# Patient Record
Sex: Female | Born: 1945 | Race: White | Hispanic: No | Marital: Married | Smoking: Never smoker
Health system: Southern US, Community
[De-identification: ages and names within clinical notes are randomized; demographics above are authoritative.]

## PROBLEM LIST (undated history)

## (undated) DIAGNOSIS — I10 Essential (primary) hypertension: Secondary | ICD-10-CM

---

## 2015-11-14 ENCOUNTER — Emergency Department (HOSPITAL_COMMUNITY)
Admission: EM | Admit: 2015-11-14 | Discharge: 2015-11-14 | Disposition: A | Discharge status: Home / Self Care | Payer: Medicare Other | Attending: Emergency Medicine | Admitting: Emergency Medicine

## 2015-11-14 ENCOUNTER — Encounter (HOSPITAL_COMMUNITY): Payer: Self-pay

## 2015-11-14 ENCOUNTER — Emergency Department (HOSPITAL_COMMUNITY): Payer: Medicare Other

## 2015-11-14 DIAGNOSIS — I10 Essential (primary) hypertension: Secondary | ICD-10-CM | POA: Insufficient documentation

## 2015-11-14 DIAGNOSIS — M545 Low back pain, unspecified: Secondary | ICD-10-CM

## 2015-11-14 DIAGNOSIS — M79605 Pain in left leg: Secondary | ICD-10-CM | POA: Diagnosis not present

## 2015-11-14 DIAGNOSIS — Z7982 Long term (current) use of aspirin: Secondary | ICD-10-CM | POA: Insufficient documentation

## 2015-11-14 HISTORY — DX: Essential (primary) hypertension: I10

## 2015-11-14 LAB — BASIC METABOLIC PANEL
ANION GAP: 9 (ref 5–15)
BUN: 21 mg/dL — ABNORMAL HIGH (ref 6–20)
CHLORIDE: 107 mmol/L (ref 101–111)
CO2: 24 mmol/L (ref 22–32)
CREATININE: 1.15 mg/dL — AB (ref 0.44–1.00)
Calcium: 9.2 mg/dL (ref 8.9–10.3)
GFR calc non Af Amer: 47 mL/min — ABNORMAL LOW (ref >60)
GFR, EST AFRICAN AMERICAN: 54 mL/min — AB (ref >60)
Glucose, Bld: 100 mg/dL — ABNORMAL HIGH (ref 65–99)
POTASSIUM: 3.8 mmol/L (ref 3.5–5.1)
SODIUM: 140 mmol/L (ref 135–145)

## 2015-11-14 LAB — D-DIMER, QUANTITATIVE: D-Dimer, Quant: <0.27 ug/mL-FEU (ref 0.00–0.50)

## 2015-11-14 MED ORDER — PREDNISONE 20 MG PO TABS
60.0000 mg | ORAL_TABLET | ORAL | Status: AC
  Administered 2015-11-14: 60 mg via ORAL
  Filled 2015-11-14: qty 3

## 2015-11-14 MED ORDER — KETOROLAC TROMETHAMINE 30 MG/ML IJ SOLN
15.0000 mg | Freq: Once | INTRAMUSCULAR | Status: AC
  Administered 2015-11-14: 15 mg via INTRAMUSCULAR
  Filled 2015-11-14: qty 1

## 2015-11-14 MED ORDER — PREDNISONE 20 MG PO TABS: 40.0000 mg | ORAL_TABLET | Freq: Every day | ORAL | Status: AC

## 2015-11-14 MED ORDER — NAPROXEN 500 MG PO TABS: 500.0000 mg | ORAL_TABLET | Freq: Two times a day (BID) | ORAL | Status: AC

## 2015-11-14 NOTE — ED Notes (Signed)
Patient transported to X-ray 

## 2015-11-14 NOTE — ED Notes (Addendum)
Patient complains of left leg pain x 2 days. Intermittent described as sharp and soreness when the pain resides. Radiates from hip to knee, denies trauma. [place] but pain started Thursday evening

## 2015-11-14 NOTE — ED Provider Notes (Signed)
CSN: [id number]     Arrival date & time 11/14/15  0809 History   First MD Initiated Contact with Patient 11/14/15 [phone]     Chief Complaint  Patient presents with  . leg pain      (Consider location/radiation/quality/duration/timing/severity/associated sxs/prior Treatment) HPI Patient presents with concern of pain in the left lower back, left leg. Pain began about 2 days ago, without clear precipitant. Since onset symptoms of been persistent, though with waxing/waning severity. Pain is sharp, burning, with intermittent bursts in the left posterior gluteal area radiating down the left leg inferiorly on the lateral and posterior aspect. Patient did recently travel, though this occurred after initiation of the pain. She denies medical problems, beyond hypertension. She does acknowledge swelling in the left leg greater than right leg, though this may be chronic. Patient also notices new discoloration in the left lateral ankle, no clear precipitant. Currently no other complaints, including dyspnea, chest pain, lightheadedness.  Past Medical History  Diagnosis Date  . Hypertension    History reviewed. No pertinent past surgical history. No family history on file. Social History  Substance Use Topics  . Smoking status: Never Smoker   . Smokeless tobacco: None  . Alcohol Use: None   OB History    No data available     Review of Systems  Constitutional:       Per HPI, otherwise negative  HENT:       Per HPI, otherwise negative  Respiratory:       Per HPI, otherwise negative  Cardiovascular:       Per HPI, otherwise negative  Gastrointestinal: Negative for vomiting.  Endocrine:       Negative aside from HPI  Genitourinary:       Neg aside from HPI   Musculoskeletal:       Per HPI, otherwise negative  Skin: Positive for color change.  Neurological: Negative for syncope.      Allergies  Review of patient's allergies indicates no known allergies.  Home Medications    Prior to Admission medications   Medication Sig Start Date End Date Taking? Authorizing Provider  aspirin 81 MG tablet Take 81 mg by mouth daily.   Yes Historical Provider, MD  losartan (COZAAR) 50 MG tablet Take 50 mg by mouth daily.   Yes Historical Provider, MD  Multiple Vitamins-Minerals (MULTIVITAMIN WITH MINERALS) tablet Take 1 tablet by mouth daily.   Yes Historical Provider, MD   BP 123/82 mmHg  Pulse 83  Temp(Src) 98.5 F (36.9 C) (Oral)  Resp 20  Ht 5\' 2"  (1.575 m)  Wt 155 lb (70.308 kg)  BMI 28.34 kg/m2  SpO2 98% Physical Exam  Constitutional: She is oriented to person, place, and time. She appears well-developed and well-nourished. No distress.  HENT:  Head: Normocephalic and atraumatic.  Eyes: Conjunctivae and EOM are normal.  Cardiovascular: Normal rate and regular rhythm.   Pulmonary/Chest: Effort normal and breath sounds normal. No stridor. No respiratory distress.  Abdominal: She exhibits no distension.  Musculoskeletal: She exhibits no edema.  Left leg is larger than the right diffusely, no gross deformities  Neurological: She is alert and oriented to person, place, and time. No cranial nerve deficit.  Diminished strength in the left lower extremity hip, knee 4/5 compared to normal strength on the right, otherwise unremarkable neurologic exam  Skin: Skin is warm and dry.     Psychiatric: She has a normal mood and affect.  Nursing note and vitals reviewed.   ED Course  Procedures (including critical care time) Labs Review Labs Reviewed  BASIC METABOLIC PANEL - Abnormal; Notable for the following:    Glucose, Bld 100 (*)    BUN 21 (*)    Creatinine, Ser 1.15 (*)    GFR calc non Af Amer 47 (*)    GFR calc Af Amer 54 (*)    All other components within normal limits  D-DIMER, QUANTITATIVE (NOT AT [facility])    Imaging Review Dg Lumbar Spine 2-3 Views  11/14/2015  CLINICAL DATA:  Acute left lower extremity pain without known injury. EXAM: LUMBAR SPINE -  2-3 VIEW COMPARISON:  None. FINDINGS: There is no evidence of lumbar spine fracture. Alignment is normal. Intervertebral disc spaces are maintained. IMPRESSION: Normal lumbar spine. Electronically Signed   By: Lupita Raider, M.D.   On: 11/14/2015 10:00   I have personally reviewed and evaluated these images and lab results as part of my medical decision-making.  11:03 AM On repeat exam the patient appears well. We discussed all findings, including negative d-dimer, x-ray at length. With concern for new sciatica, patient will start a short course of medication, follow-up with her primary care physician upon returning home.   MDM  Patient presents with new left lower back, leg pain. Patient is elderly, has no prior similar events. Here patient's valuation is reassuring, no evidence for fracture, lytic lesions, nor evidence for DVT. Patient does have some ecchymosis in her distal ankle, though this is likely secondary to her recent car trip. Patient's otherwise reassuring findings, stable vitals, minimal pain without motion all reassuring. 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   Gerhard Munch, MD 11/14/15 1104

## 2015-11-14 NOTE — ED Notes (Signed)
Placed patient on the monitor.

## 2015-11-14 NOTE — Discharge Instructions (Signed)
As discussed, it is important that you follow-up with your physician upon returning to [place] .  In the interim, please take medication as directed, and do not hesitate to return here if you develop new, or concerning changes in your condition.

## 2017-06-19 IMAGING — DX DG LUMBAR SPINE 2-3V
3 series · 3 of 3 positions shown · non-contrast
Comparison: None.

CLINICAL DATA: Acute left lower extremity pain without known
injury.

EXAM:
LUMBAR SPINE - 2-3 VIEW

[l-spine ap]
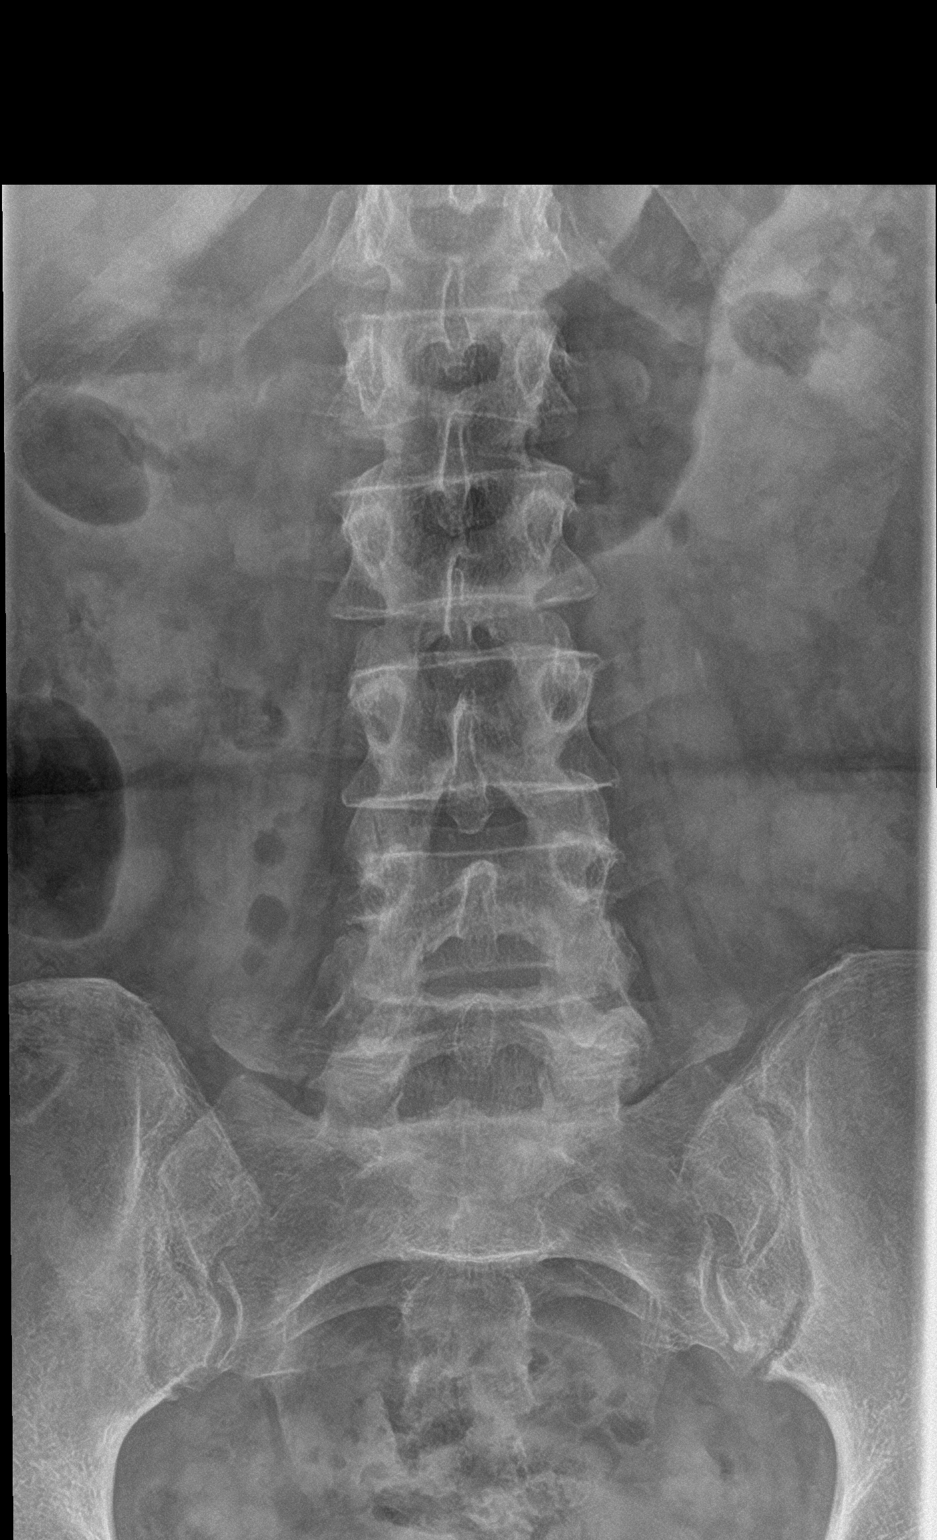

[l-spine lat]
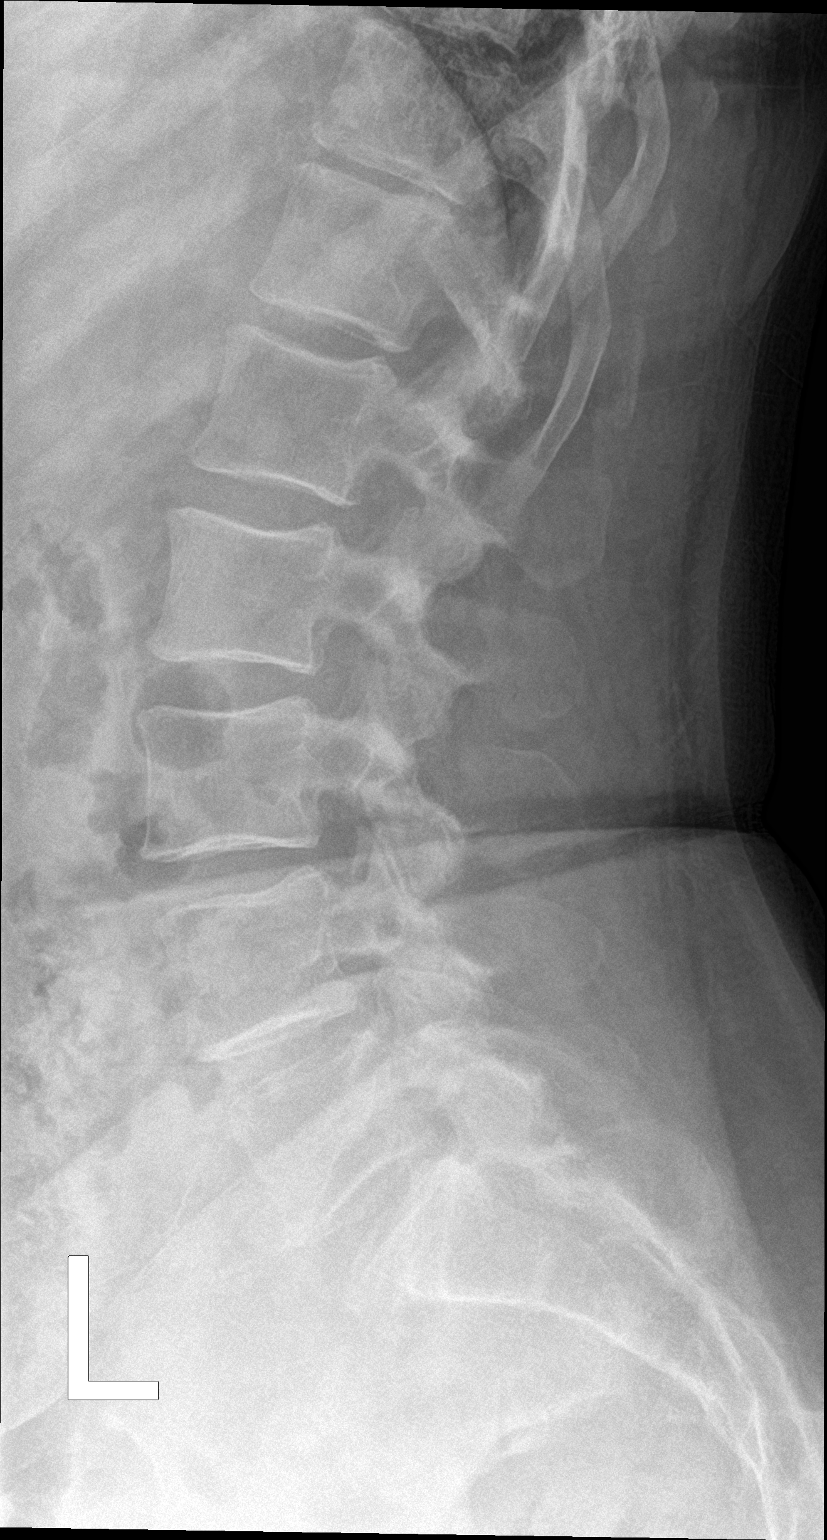

[l-spine spot]
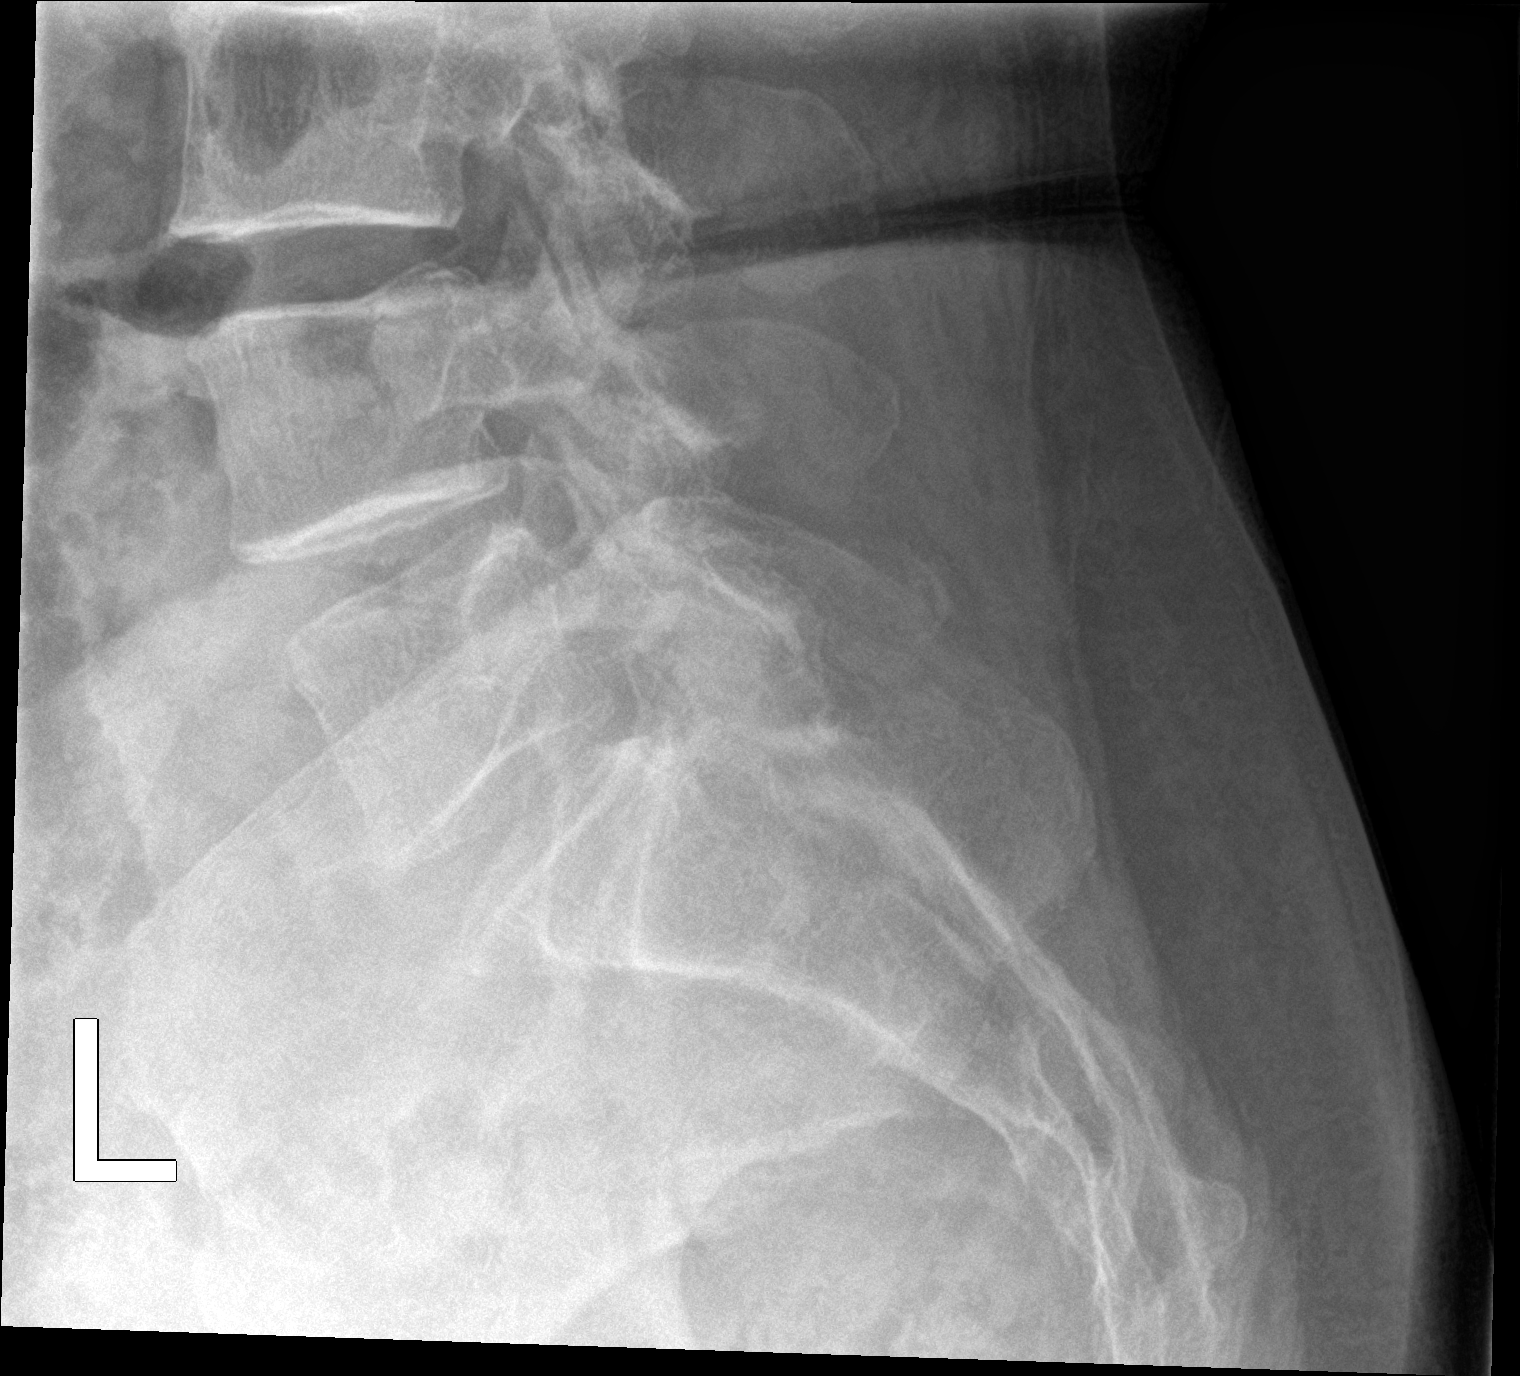

[3 of 3 positions shown; findings below may reference images not displayed]

FINDINGS: There is no evidence of lumbar spine fracture. Alignment is normal.
Intervertebral disc spaces are maintained.
IMPRESSION: Normal lumbar spine.

## 2018-04-16 ENCOUNTER — Encounter: Payer: Self-pay | Admitting: Emergency Medicine

## 2018-04-16 ENCOUNTER — Emergency Department (HOSPITAL_COMMUNITY)
Admission: EM | Admit: 2018-04-16 | Discharge: 2018-04-16 | Disposition: A | Discharge status: Home / Self Care | Payer: Medicare Other | Attending: Emergency Medicine | Admitting: Emergency Medicine

## 2018-04-16 DIAGNOSIS — I10 Essential (primary) hypertension: Secondary | ICD-10-CM | POA: Diagnosis not present

## 2018-04-16 DIAGNOSIS — Z79899 Other long term (current) drug therapy: Secondary | ICD-10-CM | POA: Insufficient documentation

## 2018-04-16 DIAGNOSIS — N938 Other specified abnormal uterine and vaginal bleeding: Secondary | ICD-10-CM | POA: Insufficient documentation

## 2018-04-16 DIAGNOSIS — N939 Abnormal uterine and vaginal bleeding, unspecified: Secondary | ICD-10-CM

## 2018-04-16 DIAGNOSIS — Z7982 Long term (current) use of aspirin: Secondary | ICD-10-CM | POA: Diagnosis not present

## 2018-04-16 LAB — CBC WITH DIFFERENTIAL/PLATELET
ABS IMMATURE GRANULOCYTES: 0.02 10*3/uL (ref 0.00–0.07)
BASOS PCT: 1 %
Basophils Absolute: 0 10*3/uL (ref 0.0–0.1)
Eosinophils Absolute: 0.2 10*3/uL (ref 0.0–0.5)
Eosinophils Relative: 4 %
HCT: 40.4 % (ref 36.0–46.0)
Hemoglobin: 12.5 g/dL (ref 12.0–15.0)
IMMATURE GRANULOCYTES: 0 %
Lymphocytes Relative: 27 %
Lymphs Abs: 1.6 10*3/uL (ref 0.7–4.0)
MCH: 26.5 pg (ref 26.0–34.0)
MCHC: 30.9 g/dL (ref 30.0–36.0)
MCV: 85.8 fL (ref 80.0–100.0)
Monocytes Absolute: 0.5 10*3/uL (ref 0.1–1.0)
Monocytes Relative: 9 %
NEUTROS ABS: 3.4 10*3/uL (ref 1.7–7.7)
Neutrophils Relative %: 59 %
PLATELETS: 283 10*3/uL (ref 150–400)
RBC: 4.71 MIL/uL (ref 3.87–5.11)
RDW: 13 % (ref 11.5–15.5)
WBC: 5.8 10*3/uL (ref 4.0–10.5)
nRBC: 0 % (ref 0.0–0.2)

## 2018-04-16 LAB — BASIC METABOLIC PANEL
Anion gap: 6 (ref 5–15)
BUN: 14 mg/dL (ref 8–23)
CO2: 24 mmol/L (ref 22–32)
Calcium: 9.4 mg/dL (ref 8.9–10.3)
Chloride: 108 mmol/L (ref 98–111)
Creatinine, Ser: 1.01 mg/dL — ABNORMAL HIGH (ref 0.44–1.00)
GFR calc Af Amer: >60 mL/min (ref >60)
GFR, EST NON AFRICAN AMERICAN: 53 mL/min — AB (ref >60)
GLUCOSE: 113 mg/dL — AB (ref 70–99)
POTASSIUM: 3.8 mmol/L (ref 3.5–5.1)
Sodium: 138 mmol/L (ref 135–145)

## 2018-04-16 LAB — WET PREP, GENITAL
Clue Cells Wet Prep HPF POC: NONE SEEN
SPERM: NONE SEEN
Trich, Wet Prep: NONE SEEN
YEAST WET PREP: NONE SEEN

## 2018-04-16 NOTE — ED Triage Notes (Signed)
Pt to ER for evaluation of bright red bleeding after robotic hysterectomy Oct. 2 2019. Reports until one week ago, bleeding was minimal and dark. Reports hx of uterine cancer. Was sent here by surgeon.

## 2018-04-16 NOTE — ED Provider Notes (Signed)
[facility] EMERGENCY DEPARTMENT Provider Note   CSN: [id number] Arrival date & time: 04/16/18  1027     History   Chief Complaint Chief Complaint  Patient presents with  . Post-op Problem    HPI Laurie Fischer is a 72 y.o. female.  HPI   72 year old female with history of uterine cancer status post robotic hysterectomy February 28, 2018 at [facility], presents with concern for vaginal bleeding.  Reports brown discharge over the last week, but today had some bright red bleeding.  Reports it has been a minimal amount, and only on her panty liner.  Reports today the blood the pain letter, and she called her gynecologist physician, who recommended she come to the emergency department.  Denies any other symptoms.  Reports she has had some mild abdominal cramping that feels like menstrual cramps which has been present since her surgery.  Denies fevers.  Reports she is been having normal bowel movements, no vomiting.  No increase in pain.  Cares for small amount of breathing bleeding at her GYN's request.  She is from [facility] the area.  Past Medical History:  Diagnosis Date  . Hypertension     There are no active problems to display for this patient.   History reviewed. No pertinent surgical history.   OB History   None      Home Medications    Prior to Admission medications   Medication Sig Start Date End Date Taking? Authorizing Provider  aspirin 81 MG tablet Take 81 mg by mouth daily.    [provider]  losartan (COZAAR) 50 MG tablet Take 50 mg by mouth daily.    [provider]  Multiple Vitamins-Minerals (MULTIVITAMIN WITH MINERALS) tablet Take 1 tablet by mouth daily.    [provider]  naproxen (NAPROSYN) 500 MG tablet Take 1 tablet (500 mg total) by mouth 2 (two) times daily. 11/14/15   Gerhard Munch, MD    Family History No family history on file.  Social History Social History   Tobacco Use  . Smoking  status: Never Smoker  Substance Use Topics  . Alcohol use: Not on file  . Drug use: Not on file     Allergies   Patient has no known allergies.   Review of Systems Review of Systems  Constitutional: Negative for fever.  Eyes: Negative for visual disturbance.  Respiratory: Negative for cough and shortness of breath.   Cardiovascular: Negative for chest pain.  Gastrointestinal: Positive for abdominal pain (mild). Negative for constipation, diarrhea, nausea and vomiting.  Genitourinary: Positive for vaginal bleeding. Negative for difficulty urinating and dysuria.  Skin: Negative for rash.  Neurological: Negative for syncope and headaches.     Physical Exam Updated Vital Signs BP 114/80   Pulse 81   Temp 98.2 F (36.8 C) (Oral)   Resp 20   SpO2 92%   Physical Exam  Constitutional: She is oriented to person, place, and time. She appears well-developed and well-nourished. No distress.  HENT:  Head: Normocephalic and atraumatic.  Eyes: Conjunctivae and EOM are normal.  Neck: Normal range of motion.  Cardiovascular: Normal rate, regular rhythm and intact distal pulses.  Pulmonary/Chest: Effort normal. No respiratory distress.  Abdominal: Soft. She exhibits no distension. There is tenderness (minimal lower abd tenderness). There is no guarding.  Laparoscopic incisions cdi  Genitourinary:  Genitourinary Comments: Sutures seen, small amount of blood present, no avtive bleeding, no visualized dehiscence   Musculoskeletal: She exhibits no edema  or tenderness.  Neurological: She is alert and oriented to person, place, and time.  Skin: Skin is warm and dry. No rash noted. She is not diaphoretic. No erythema.  Nursing note and vitals reviewed.    ED Treatments / Results  Labs (all labs ordered are listed, but only abnormal results are displayed) Labs Reviewed  WET PREP, GENITAL - Abnormal; Notable for the following components:      Result Value   WBC, Wet Prep HPF POC MANY  (*)    All other components within normal limits  BASIC METABOLIC PANEL - Abnormal; Notable for the following components:   Glucose, Bld 113 (*)    Creatinine, Ser 1.01 (*)    GFR calc non Af Amer 53 (*)    All other components within normal limits  CBC WITH DIFFERENTIAL/PLATELET    EKG None  Radiology No results found.  Procedures Procedures (including critical care time)  Medications Ordered in ED Medications - No data to display   Initial Impression / Assessment and Plan / ED Course  I have reviewed the triage vital signs and the nursing notes.  Pertinent labs & imaging results that were available during my care of the patient were reviewed by me and considered in my medical decision making (see chart for details).     72 year old female with history of uterine cancer status post robotic [facility], [facility], presents with concern for vaginal bleeding.  Pelvic exam performed, with small amount of blood present, however no active bleeding, no visualized dehiscence.  She is hemodynamically stable, with a normal hemoglobin.  Discussed with her diagnostic physician, Dr. Modena JanskyKohler, and recommends pelvic rest and she follow-up with him as an outpatient. Final Clinical Impressions(s) / ED Diagnoses   Final diagnoses:  Vaginal bleeding    ED Discharge Orders    None       Alvira MondaySchlossman, Sharita Bienaime, MD 04/17/18 [phone]
